# Patient Record
Sex: Female | Born: 1955 | Race: Black or African American | Hispanic: No | Marital: Married | Smoking: Never smoker
Health system: Southern US, Community
[De-identification: ages and names within clinical notes are randomized; demographics above are authoritative.]

## PROBLEM LIST (undated history)

## (undated) DIAGNOSIS — J302 Other seasonal allergic rhinitis: Secondary | ICD-10-CM

## (undated) DIAGNOSIS — M1990 Unspecified osteoarthritis, unspecified site: Secondary | ICD-10-CM

## (undated) HISTORY — PX: PARTIAL HYSTERECTOMY: SHX80

## (undated) HISTORY — PX: ABDOMINAL HYSTERECTOMY: SHX81

---

## 2008-05-10 ENCOUNTER — Ambulatory Visit: Payer: Self-pay | Admitting: Dermatology

## 2008-11-23 ENCOUNTER — Ambulatory Visit: Payer: Self-pay | Admitting: Family Medicine

## 2008-12-29 ENCOUNTER — Ambulatory Visit: Payer: Self-pay | Admitting: Gastroenterology

## 2010-03-26 ENCOUNTER — Other Ambulatory Visit: Payer: Self-pay | Admitting: Family Medicine

## 2010-04-04 ENCOUNTER — Ambulatory Visit: Payer: Self-pay | Admitting: Family Medicine

## 2010-10-25 ENCOUNTER — Other Ambulatory Visit: Payer: Self-pay | Admitting: Unknown Physician Specialty

## 2012-02-03 ENCOUNTER — Ambulatory Visit: Payer: Self-pay | Admitting: Family

## 2012-02-10 ENCOUNTER — Other Ambulatory Visit: Payer: Self-pay | Admitting: Family

## 2012-02-10 LAB — BASIC METABOLIC PANEL
Anion Gap: 9 (ref 7–16)
Co2: 29 mmol/L (ref 21–32)
Creatinine: 0.68 mg/dL (ref 0.60–1.30)
EGFR (African American): >60
EGFR (Non-African Amer.): >60
Glucose: 95 mg/dL (ref 65–99)
Potassium: 4.2 mmol/L (ref 3.5–5.1)
Sodium: 142 mmol/L (ref 136–145)

## 2012-02-10 LAB — CBC WITH DIFFERENTIAL/PLATELET
Basophil #: 0 10*3/uL (ref 0.0–0.1)
Basophil %: 0.9 %
Eosinophil #: 0.3 10*3/uL (ref 0.0–0.7)
Eosinophil %: 7.1 %
HCT: 42.6 % (ref 35.0–47.0)
HGB: 14.2 g/dL (ref 12.0–16.0)
Lymphocyte %: 37.7 %
MCH: 30.9 pg (ref 26.0–34.0)
Monocyte #: 0.6 10*3/uL (ref 0.0–0.7)
Monocyte %: 15.8 %
Neutrophil %: 38.5 %
Platelet: 210 10*3/uL (ref 150–440)
RBC: 4.58 10*6/uL (ref 3.80–5.20)
WBC: 3.8 10*3/uL (ref 3.6–11.0)

## 2012-02-10 LAB — LIPID PANEL
HDL Cholesterol: 51 mg/dL (ref 40–60)
Ldl Cholesterol, Calc: 111 mg/dL — ABNORMAL HIGH (ref 0–100)
Triglycerides: 131 mg/dL (ref 0–200)
VLDL Cholesterol, Calc: 26 mg/dL (ref 5–40)

## 2012-03-16 ENCOUNTER — Encounter: Payer: Self-pay | Admitting: Nurse Practitioner

## 2012-03-24 ENCOUNTER — Ambulatory Visit: Payer: Self-pay | Admitting: Internal Medicine

## 2012-03-25 ENCOUNTER — Encounter: Payer: Self-pay | Admitting: Nurse Practitioner

## 2012-04-25 ENCOUNTER — Encounter: Payer: Self-pay | Admitting: Nurse Practitioner

## 2013-05-12 ENCOUNTER — Ambulatory Visit: Payer: Self-pay | Admitting: Internal Medicine

## 2014-10-18 ENCOUNTER — Ambulatory Visit: Payer: Self-pay | Admitting: Internal Medicine

## 2016-02-23 ENCOUNTER — Other Ambulatory Visit: Payer: Self-pay | Admitting: Internal Medicine

## 2016-02-23 DIAGNOSIS — Z1231 Encounter for screening mammogram for malignant neoplasm of breast: Secondary | ICD-10-CM

## 2016-03-05 ENCOUNTER — Ambulatory Visit
Admission: RE | Admit: 2016-03-05 | Discharge: 2016-03-05 | Disposition: A | Discharge status: Home / Self Care | Payer: BC Managed Care – PPO | Source: Ambulatory Visit | Attending: Internal Medicine | Admitting: Internal Medicine

## 2016-03-05 DIAGNOSIS — Z1231 Encounter for screening mammogram for malignant neoplasm of breast: Secondary | ICD-10-CM | POA: Diagnosis not present

## 2017-04-29 ENCOUNTER — Ambulatory Visit
Admission: RE | Admit: 2017-04-29 | Discharge: 2017-04-29 | Disposition: A | Discharge status: Home / Self Care | Payer: BC Managed Care – PPO | Source: Ambulatory Visit | Attending: Internal Medicine | Admitting: Internal Medicine

## 2017-04-29 ENCOUNTER — Other Ambulatory Visit: Payer: Self-pay | Admitting: Cardiology

## 2017-04-29 ENCOUNTER — Ambulatory Visit
Admission: RE | Admit: 2017-04-29 | Discharge: 2017-04-29 | Disposition: A | Discharge status: Home / Self Care | Payer: BC Managed Care – PPO | Source: Ambulatory Visit | Attending: Cardiology | Admitting: Cardiology

## 2017-04-29 DIAGNOSIS — M4804 Spinal stenosis, thoracic region: Secondary | ICD-10-CM | POA: Insufficient documentation

## 2017-04-29 DIAGNOSIS — R52 Pain, unspecified: Secondary | ICD-10-CM | POA: Diagnosis not present

## 2017-04-29 DIAGNOSIS — M47897 Other spondylosis, lumbosacral region: Secondary | ICD-10-CM | POA: Diagnosis not present

## 2017-05-01 ENCOUNTER — Encounter: Payer: Self-pay | Admitting: Emergency Medicine

## 2017-05-01 ENCOUNTER — Emergency Department
Admission: EM | Admit: 2017-05-01 | Discharge: 2017-05-01 | Disposition: A | Discharge status: Home / Self Care | Payer: BC Managed Care – PPO | Attending: Emergency Medicine | Admitting: Emergency Medicine

## 2017-05-01 DIAGNOSIS — Z5321 Procedure and treatment not carried out due to patient leaving prior to being seen by health care provider: Secondary | ICD-10-CM | POA: Diagnosis not present

## 2017-05-01 DIAGNOSIS — M25552 Pain in left hip: Secondary | ICD-10-CM | POA: Diagnosis not present

## 2017-05-01 DIAGNOSIS — I10 Essential (primary) hypertension: Secondary | ICD-10-CM | POA: Diagnosis present

## 2017-05-01 HISTORY — DX: Unspecified osteoarthritis, unspecified site: M19.90

## 2017-05-01 NOTE — ED Triage Notes (Signed)
Pt comes into the ED via POV c/o hypertension and left hip pain.  Patient has been seeing PCP about hip pain and it has been diagnosed as arthritis.  Patient states she had her BP checked today and it was 190/109 earlier today and the nurse suggested she come to the ER.  Patient denies any h/o hypertension.  Denies any current chest pain, shortness of breath, or dizziness.  Patient able to ambulate well to triage and has even and unlabored respirations.

## 2017-05-01 NOTE — ED Notes (Signed)
Pt called in lobby, no response 

## 2017-05-01 NOTE — ED Notes (Signed)
Pt called in lobby for VSS, no response

## 2017-05-02 ENCOUNTER — Telehealth: Payer: Self-pay | Admitting: Emergency Medicine

## 2017-05-02 ENCOUNTER — Other Ambulatory Visit: Payer: Self-pay | Admitting: Internal Medicine

## 2017-05-02 DIAGNOSIS — R109 Unspecified abdominal pain: Secondary | ICD-10-CM

## 2017-05-02 NOTE — Telephone Encounter (Signed)
Called patient due to lwot to inquire about condition and follow up plans. Says her pcp called her and she is seeing him this am.

## 2017-05-08 ENCOUNTER — Ambulatory Visit
Admission: RE | Admit: 2017-05-08 | Discharge: 2017-05-08 | Disposition: A | Discharge status: Home / Self Care | Payer: BC Managed Care – PPO | Source: Ambulatory Visit | Attending: Internal Medicine | Admitting: Internal Medicine

## 2017-05-08 DIAGNOSIS — R109 Unspecified abdominal pain: Secondary | ICD-10-CM | POA: Insufficient documentation

## 2017-05-19 ENCOUNTER — Encounter: Payer: Self-pay | Admitting: Obstetrics and Gynecology

## 2017-05-19 ENCOUNTER — Ambulatory Visit (INDEPENDENT_AMBULATORY_CARE_PROVIDER_SITE_OTHER): Payer: BC Managed Care – PPO | Admitting: Obstetrics and Gynecology

## 2017-05-19 VITALS — BP 112/72 | HR 78 | Ht 62.0 in | Wt 179.0 lb

## 2017-05-19 DIAGNOSIS — R1032 Left lower quadrant pain: Secondary | ICD-10-CM

## 2017-05-19 DIAGNOSIS — N761 Subacute and chronic vaginitis: Secondary | ICD-10-CM | POA: Diagnosis not present

## 2017-05-19 LAB — POCT WET PREP WITH KOH
CLUE CELLS WET PREP PER HPF POC: NEGATIVE
KOH Prep POC: NEGATIVE
Trichomonas, UA: NEGATIVE
Yeast Wet Prep HPF POC: NEGATIVE

## 2017-05-19 MED ORDER — CLOTRIMAZOLE-BETAMETHASONE 1-0.05 % EX CREA: 1.0000 "application " | TOPICAL_CREAM | Freq: Two times a day (BID) | CUTANEOUS | 0 refills | Status: AC

## 2017-05-19 NOTE — Progress Notes (Signed)
Chief Complaint  Patient presents with  . Pelvic Pain    HPI:      Ms. Marilyn Morgan is a 61 y.o. [phone] who LMP was No LMP recorded. Patient has had a hysterectomy., presents today for NP eval of pelvic pain and vaginal irritation. Pt complains of LT hip pain that radiated to LLQ for about 2 wks earlier this month. Pain was sharp and constant initially, worse with walking, standing, sitting, lying down. Pain started to decrease towards the end of 2 wks and only occurred with lying down. Pt also had pain radiating down back of LT leg. No GI or urin sx with pain. She has had similar sx off and on over the past couple of yrs. 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 She had xrays with PCP that showed arthritis in LT hip. She was given meloxicam and gabapentin. Sx have resolved and no sx today. She had a neg abd CT scan with PCP, too. She is s/p lap hyst for leio. Pt states she only has 1 ovary now. No VB, spotting.   She also complains of 6 months of vaginal itching/irritation without increased d/c, mild odor. She has not used meds to treat. She uses caress body wash and dryer sheets.      There are no active problems to display for this patient.  Past Surgical History:  Procedure Laterality Date  . CESAREAN SECTION     x2  . PARTIAL HYSTERECTOMY       Family History  Problem Relation Age of Onset  . COPD Mother   . Diabetes Mother   . Aneurysm Father   . COPD Father   . Stroke Father     Social History   Social History  . Marital status: Married    Spouse name: N/A  . Number of children: N/A  . Years of education: N/A   Occupational History  . Not on file.   Social History Main Topics  . Smoking status: Never Smoker  . Smokeless tobacco: Never Used  . Alcohol use No  . Drug use: No  . Sexual activity: Yes    Birth control/ protection: Surgical   Other Topics Concern  . Not on file   Social History Narrative  . No narrative on file      Current Outpatient Prescriptions:  .  cetirizine (ZYRTEC) 10 MG tablet, Take 10 mg by mouth daily., Disp: , Rfl: 9 .  clotrimazole-betamethasone (LOTRISONE) cream, Apply 1 application topically 2 (two) times daily. Apply externally BID for 2 wks, Disp: 15 g, Rfl: 0  Review of Systems  Constitutional: Negative for fever.  Gastrointestinal: Negative for blood in stool, constipation, diarrhea, nausea and vomiting.  Genitourinary: Positive for pelvic pain. Negative for dyspareunia, dysuria, flank pain, frequency, hematuria, urgency, vaginal bleeding, vaginal discharge and vaginal pain.  Musculoskeletal: Positive for arthralgias and back pain.  Skin: Negative for rash.     OBJECTIVE:   Vitals:  BP 112/72   Pulse 78   Ht 5\' 2"  (1.575 m)   Wt 179 lb (81.2 kg)   BMI 32.74 kg/m   Physical Exam  Constitutional: She is oriented to person, place, and time and well-developed, well-nourished, and in no distress. Vital signs are normal.  Abdominal: Normal appearance. She exhibits no distension and no mass. There is no tenderness.  Genitourinary: Right adnexa normal and left adnexa normal. Right adnexum displays no mass and no tenderness. Left adnexum  displays no mass and no tenderness. Vulva exhibits no erythema, no exudate, no lesion, no rash and no tenderness. Vagina exhibits no lesion. Thin  odorless  white and vaginal discharge found.  Genitourinary Comments: VULVA WITH PALE, HYPERTROPHIED ARE LT LABIA MAJORA AND PERINEAL AREA  UTERUS/CX SURG ABSENT  Neurological: She is oriented to person, place, and time.  Vitals reviewed.   Results: Results for orders placed or performed in visit on 05/19/17 (from the past 24 hour(s))  POCT Wet Prep with KOH     Status: Normal   Collection Time: 05/19/17  5:08 PM  Result Value Ref Range   Trichomonas, UA Negative    Clue Cells Wet Prep HPF POC neg    Epithelial Wet Prep HPF POC  Few, Moderate, Many, Too numerous to count   Yeast Wet Prep HPF  POC neg    Bacteria Wet Prep HPF POC  Few   RBC Wet Prep HPF POC     WBC Wet Prep HPF POC     KOH Prep POC Negative Negative     Assessment/Plan:  LLQ pain - Resolved. Prob most likely related to LT hip arthritis. If sx recur, will check u/s.  Chronic vaginitis - Neg wet prep/pos exam. Question fungal vs chem. Rx clotrimazole/betamethasone crm BID for 2 wks. F/u if sx persist for further eval.  - Plan: POCT Wet Prep with KOH  Line dry underwear/dove sens skin soap.      No Follow-up on file.  Cayli Escajeda B. Dajana Gehrig, PA-C 05/19/2017 5:10 PM

## 2019-05-11 ENCOUNTER — Other Ambulatory Visit: Payer: Self-pay | Admitting: Internal Medicine

## 2019-05-11 DIAGNOSIS — Z1231 Encounter for screening mammogram for malignant neoplasm of breast: Secondary | ICD-10-CM

## 2019-06-17 ENCOUNTER — Ambulatory Visit
Admission: RE | Admit: 2019-06-17 | Discharge: 2019-06-17 | Disposition: A | Discharge status: Home / Self Care | Payer: BC Managed Care – PPO | Source: Ambulatory Visit | Attending: Internal Medicine | Admitting: Internal Medicine

## 2019-06-17 DIAGNOSIS — Z1231 Encounter for screening mammogram for malignant neoplasm of breast: Secondary | ICD-10-CM | POA: Diagnosis not present

## 2019-07-06 ENCOUNTER — Other Ambulatory Visit: Payer: Self-pay | Admitting: Internal Medicine

## 2019-07-06 DIAGNOSIS — N6320 Unspecified lump in the left breast, unspecified quadrant: Secondary | ICD-10-CM

## 2019-07-06 DIAGNOSIS — R928 Other abnormal and inconclusive findings on diagnostic imaging of breast: Secondary | ICD-10-CM

## 2019-07-14 ENCOUNTER — Ambulatory Visit
Admission: RE | Admit: 2019-07-14 | Discharge: 2019-07-14 | Disposition: A | Discharge status: Home / Self Care | Payer: BC Managed Care – PPO | Source: Ambulatory Visit | Attending: Internal Medicine | Admitting: Internal Medicine

## 2019-07-14 DIAGNOSIS — R928 Other abnormal and inconclusive findings on diagnostic imaging of breast: Secondary | ICD-10-CM | POA: Diagnosis present

## 2019-07-14 DIAGNOSIS — N6320 Unspecified lump in the left breast, unspecified quadrant: Secondary | ICD-10-CM | POA: Insufficient documentation

## 2020-10-05 ENCOUNTER — Other Ambulatory Visit: Payer: Self-pay | Admitting: Family Medicine

## 2020-10-05 DIAGNOSIS — Z1231 Encounter for screening mammogram for malignant neoplasm of breast: Secondary | ICD-10-CM

## 2020-11-27 ENCOUNTER — Ambulatory Visit
Admission: RE | Admit: 2020-11-27 | Discharge: 2020-11-27 | Disposition: A | Discharge status: Home / Self Care | Payer: BC Managed Care – PPO | Source: Ambulatory Visit | Attending: Family Medicine | Admitting: Family Medicine

## 2020-11-27 ENCOUNTER — Other Ambulatory Visit: Payer: Self-pay

## 2020-11-27 DIAGNOSIS — Z1231 Encounter for screening mammogram for malignant neoplasm of breast: Secondary | ICD-10-CM | POA: Diagnosis present

## 2021-03-14 ENCOUNTER — Other Ambulatory Visit: Admission: RE | Admit: 2021-03-14 | Payer: BC Managed Care – PPO | Source: Ambulatory Visit

## 2021-03-15 ENCOUNTER — Encounter: Payer: Self-pay | Admitting: *Deleted

## 2021-03-16 ENCOUNTER — Other Ambulatory Visit: Payer: Self-pay

## 2021-03-16 ENCOUNTER — Ambulatory Visit
Admission: RE | Admit: 2021-03-16 | Discharge: 2021-03-16 | Disposition: A | Discharge status: Home / Self Care | Payer: BC Managed Care – PPO | Attending: Gastroenterology | Admitting: Gastroenterology

## 2021-03-16 ENCOUNTER — Encounter: Payer: Self-pay | Admitting: *Deleted

## 2021-03-16 ENCOUNTER — Ambulatory Visit: Payer: BC Managed Care – PPO | Admitting: Certified Registered"

## 2021-03-16 ENCOUNTER — Encounter
Admission: RE | Disposition: A | Discharge status: Home / Self Care | Payer: Self-pay | Source: Home / Self Care | Attending: Gastroenterology

## 2021-03-16 DIAGNOSIS — K640 First degree hemorrhoids: Secondary | ICD-10-CM | POA: Diagnosis not present

## 2021-03-16 DIAGNOSIS — K5730 Diverticulosis of large intestine without perforation or abscess without bleeding: Secondary | ICD-10-CM | POA: Diagnosis not present

## 2021-03-16 DIAGNOSIS — Z79899 Other long term (current) drug therapy: Secondary | ICD-10-CM | POA: Insufficient documentation

## 2021-03-16 DIAGNOSIS — Z1211 Encounter for screening for malignant neoplasm of colon: Secondary | ICD-10-CM | POA: Insufficient documentation

## 2021-03-16 DIAGNOSIS — Z791 Long term (current) use of non-steroidal anti-inflammatories (NSAID): Secondary | ICD-10-CM | POA: Insufficient documentation

## 2021-03-16 DIAGNOSIS — K6389 Other specified diseases of intestine: Secondary | ICD-10-CM | POA: Diagnosis not present

## 2021-03-16 HISTORY — DX: Other seasonal allergic rhinitis: J30.2

## 2021-03-16 HISTORY — PX: COLONOSCOPY WITH PROPOFOL: SHX5780

## 2021-03-16 SURGERY — COLONOSCOPY WITH PROPOFOL
Anesthesia: General

## 2021-03-16 MED ORDER — SODIUM CHLORIDE 0.9 % IV SOLN: INTRAVENOUS | Status: DC

## 2021-03-16 MED ORDER — PROPOFOL 500 MG/50ML IV EMUL
INTRAVENOUS | Status: DC | PRN
  Administered 2021-03-16: 165 ug/kg/min via INTRAVENOUS

## 2021-03-16 MED ORDER — PROPOFOL 10 MG/ML IV BOLUS
INTRAVENOUS | Status: DC | PRN
  Administered 2021-03-16: 70 mg via INTRAVENOUS
  Administered 2021-03-16 (×3): 20 mg via INTRAVENOUS

## 2021-03-16 MED ORDER — LIDOCAINE HCL (CARDIAC) PF 100 MG/5ML IV SOSY
PREFILLED_SYRINGE | INTRAVENOUS | Status: DC | PRN
  Administered 2021-03-16: 100 mg via INTRAVENOUS

## 2021-03-16 NOTE — Anesthesia Preprocedure Evaluation (Addendum)
Anesthesia Evaluation  Patient identified by MRN, date of birth, ID band Patient awake    Reviewed: Allergy & Precautions, NPO status , Patient's Chart, lab work & pertinent test results  History of Anesthesia Complications Negative for: history of anesthetic complications  Airway Mallampati: II       Dental   Pulmonary neg sleep apnea, neg COPD, Not current smoker,           Cardiovascular (-) hypertension(-) Past MI and (-) CHF (-) dysrhythmias (-) Valvular Problems/Murmurs     Neuro/Psych neg Seizures    GI/Hepatic Neg liver ROS, neg GERD  ,  Endo/Other  neg diabetes  Renal/GU negative Renal ROS     Musculoskeletal   Abdominal   Peds  Hematology   Anesthesia Other Findings   Reproductive/Obstetrics                             Anesthesia Physical Anesthesia Plan  ASA: II  Anesthesia Plan: General   Post-op Pain Management:    Induction: Intravenous  PONV Risk Score and Plan: 3 and Propofol infusion and TIVA  Airway Management Planned: Nasal Cannula  Additional Equipment:   Intra-op Plan:   Post-operative Plan:   Informed Consent: I have reviewed the patients History and Physical, chart, labs and discussed the procedure including the risks, benefits and alternatives for the proposed anesthesia with the patient or authorized representative who has indicated his/her understanding and acceptance.       Plan Discussed with:   Anesthesia Plan Comments:        Anesthesia Quick Evaluation

## 2021-03-16 NOTE — Op Note (Signed)
[facility] Gastroenterology Patient Name: Marilyn Morgan Procedure Date: 03/16/2021 1:32 PM MRN: [id number] Account #: [id number] Date of Birth: [birth year] Admit Type: Outpatient Age: 65 Room: [facility] ENDO ROOM 3 Gender: Female Note Status: Finalized Procedure:             Colonoscopy Indications:           Screening for colorectal malignant neoplasm Providers:             Andrey Farmer MD, MD Medicines:             Monitored Anesthesia Care Complications:         No immediate complications. Estimated blood loss:                         Minimal. Procedure:             Pre-Anesthesia Assessment:                        - Prior to the procedure, a History and Physical was                         performed, and patient medications and allergies were                         reviewed. The patient is competent. The risks and                         benefits of the procedure and the sedation options and                         risks were discussed with the patient. All questions                         were answered and informed consent was obtained.                         Patient identification and proposed procedure were                         verified by the physician, the nurse, the anesthetist                         and the technician in the endoscopy suite. Mental                         Status Examination: alert and oriented. Airway                         Examination: normal oropharyngeal airway and neck                         mobility. Respiratory Examination: clear to                         auscultation. CV Examination: normal. Prophylactic                         Antibiotics: The patient does not require prophylactic  antibiotics. Prior Anticoagulants: The patient has                         taken no previous anticoagulant or antiplatelet                         agents. ASA Grade Assessment: II - A patient with mild                          systemic disease. After reviewing the risks and                         benefits, the patient was deemed in satisfactory                         condition to undergo the procedure. The anesthesia                         plan was to use monitored anesthesia care (MAC).                         Immediately prior to administration of medications,                         the patient was re-assessed for adequacy to receive                         sedatives. The heart rate, respiratory rate, oxygen                         saturations, blood pressure, adequacy of pulmonary                         ventilation, and response to care were monitored                         throughout the procedure. The physical status of the                         patient was re-assessed after the procedure.                        After obtaining informed consent, the colonoscope was                         passed under direct vision. Throughout the procedure,                         the patient's blood pressure, pulse, and oxygen                         saturations were monitored continuously. The                         Colonoscope was introduced through the anus and                         advanced to the the cecum, identified by appendiceal  orifice and ileocecal valve. The colonoscopy was                         performed without difficulty. The patient tolerated                         the procedure well. The quality of the bowel                         preparation was good. Findings:      The perianal and digital rectal examinations were normal.      A localized area of moderately granular mucosa was found at the       ileocecal valve. Initially appeared as adenomatous polyp but more likely       the transition from ileal mucosa to colonic mucosa. Biopsies were taken       with a cold forceps for histology. Estimated blood loss was minimal.      A single small-mouthed diverticulum was  found in the sigmoid colon.      Internal hemorrhoids were found during retroflexion. The hemorrhoids       were Grade I (internal hemorrhoids that do not prolapse).      The exam was otherwise without abnormality on direct and retroflexion       views. Impression:            - Granular mucosa at the ileocecal valve. Biopsied.                        - Diverticulosis in the sigmoid colon.                        - Internal hemorrhoids.                        - The examination was otherwise normal on direct and                         retroflexion views. Recommendation:        - Discharge patient to home.                        - Resume previous diet.                        - Continue present medications.                        - Await pathology results.                        - Repeat colonoscopy for surveillance based on                         pathology results.                        - Return to referring physician as previously                         scheduled. Procedure Code(s):     --- Professional ---  45380, Colonoscopy, flexible; with biopsy, single or                         multiple Diagnosis Code(s):     --- Professional ---                        Z12.11, Encounter for screening for malignant neoplasm                         of colon                        K63.89, Other specified diseases of intestine                        K64.0, First degree hemorrhoids                        K57.30, Diverticulosis of large intestine without                         perforation or abscess without bleeding CPT copyright 2019 [employer]. All rights reserved. The codes documented in this report are preliminary and upon coder review may  be revised to meet current compliance requirements. Andrey Farmer MD, MD 03/16/2021 2:10:15 PM Number of Addenda: 0 Note Initiated On: 03/16/2021 1:32 PM Total Procedure Duration: 0 hours 17 minutes 6 seconds   Estimated Blood Loss:  Estimated blood loss was minimal.      [facility]

## 2021-03-16 NOTE — Anesthesia Procedure Notes (Signed)
Procedure Name: General with mask airway Performed by: Fletcher-Harrison, Phelan Schadt, CRNA Pre-anesthesia Checklist: Patient identified, Emergency Drugs available, Suction available and Patient being monitored Patient Re-evaluated:Patient Re-evaluated prior to induction Oxygen Delivery Method: Simple face mask Induction Type: IV induction Placement Confirmation: positive ETCO2 and CO2 detector Dental Injury: Teeth and Oropharynx as per pre-operative assessment        

## 2021-03-16 NOTE — H&P (Signed)
Outpatient short stay form Pre-procedure 03/16/2021 1:37 PM Merlyn Lot MD, MPH  Primary Physician: PA Shelton Silvas  Reason for visit:  Screening  History of present illness:   65 y/o lady with no significant medical history here for screening colonoscopy. Had colonoscopy over 10 years ago but unknown results. History of hysterectomy and c-sections. No blood thinners. No family history of GI malignancies.    Current Facility-Administered Medications:  .  0.9 %  sodium chloride infusion, , Intravenous, Continuous, Havier Deeb, Rossie Muskrat, MD, Last Rate: 20 mL/hr at 03/16/21 1316, New Bag at 03/16/21 1316  Medications Prior to Admission  Medication Sig Dispense Refill Last Dose  . cetirizine (ZYRTEC) 10 MG tablet Take 10 mg by mouth daily.  9 Past Month at Unknown time  . fluticasone (FLONASE) 50 MCG/ACT nasal spray Place 2 sprays into both nostrils daily.     . clotrimazole-betamethasone (LOTRISONE) cream Apply 1 application topically 2 (two) times daily. Apply externally BID for 2 wks (Patient not taking: Reported on 03/16/2021) 15 g 0 Not Taking at Unknown time  . meloxicam (MOBIC) 7.5 MG tablet Take 7.5 mg by mouth daily. (Patient not taking: Reported on 03/16/2021)   Not Taking at Unknown time     No Known Allergies   Past Medical History:  Diagnosis Date  . Arthritis   . Seasonal allergies     Review of systems:  Otherwise negative.    Physical Exam  Gen: Alert, oriented. Appears stated age.  HEENT: PERRLA. Lungs: No respiratory distress CV: RRR Abd: soft, benign, no masses Ext: No edema    Planned procedures: Proceed with colonoscopy. The patient understands the nature of the planned procedure, indications, risks, alternatives and potential complications including but not limited to bleeding, infection, perforation, damage to internal organs and possible oversedation/side effects from anesthesia. The patient agrees and gives consent to proceed.  Please refer to  procedure notes for findings, recommendations and patient disposition/instructions.     Merlyn Lot MD, MPH Gastroenterology 03/16/2021  1:37 PM

## 2021-03-16 NOTE — Interval H&P Note (Signed)
History and Physical Interval Note:  03/16/2021 1:39 PM  Marilyn Morgan  has presented today for surgery, with the diagnosis of CCA Screen.  The various methods of treatment have been discussed with the patient and family. After consideration of risks, benefits and other options for treatment, the patient has consented to  Procedure(s): COLONOSCOPY WITH PROPOFOL (N/A) as a surgical intervention.  The patient's history has been reviewed, patient examined, no change in status, stable for surgery.  I have reviewed the patient's chart and labs.  Questions were answered to the patient's satisfaction.     Regis Bill  Ok to proceed with colonoscopy

## 2021-03-16 NOTE — Transfer of Care (Signed)
Immediate Anesthesia Transfer of Care Note  Patient: Marilyn Morgan  Procedure(s) Performed: COLONOSCOPY WITH PROPOFOL (N/A )  Patient Location: Endoscopy Unit  Anesthesia Type:General  Level of Consciousness: awake, drowsy and patient cooperative  Airway & Oxygen Therapy: Patient Spontanous Breathing and Patient connected to face mask oxygen  Post-op Assessment: Report given to RN and Post -op Vital signs reviewed and stable  Post vital signs: Reviewed and stable  Last Vitals:  Vitals Value Taken Time  BP 139/97 03/16/21 1410  Temp    Pulse 86 03/16/21 1410  Resp 19 03/16/21 1410  SpO2 98 % 03/16/21 1410  Vitals shown include unvalidated device data.  Last Pain:  Vitals:   03/16/21 1259  TempSrc: Temporal  PainSc: 0-No pain         Complications: No complications documented.

## 2021-03-19 ENCOUNTER — Encounter: Payer: Self-pay | Admitting: Gastroenterology

## 2021-03-20 LAB — SURGICAL PATHOLOGY

## 2021-03-22 NOTE — Anesthesia Postprocedure Evaluation (Addendum)
Anesthesia Post Note  Patient: Marilyn Morgan  Procedure(s) Performed: COLONOSCOPY WITH PROPOFOL (N/A )  Patient location during evaluation: Endoscopy Anesthesia Type: General Level of consciousness: awake and alert Pain management: pain level controlled Vital Signs Assessment: post-procedure vital signs reviewed and stable Respiratory status: spontaneous breathing, nonlabored ventilation, respiratory function stable and patient connected to nasal cannula oxygen Cardiovascular status: blood pressure returned to baseline and stable Postop Assessment: no apparent nausea or vomiting Anesthetic complications: no   No complications documented.   Last Vitals:  Vitals:   03/16/21 1259 03/16/21 1410  BP: (!) 164/98 (!) 139/97  Pulse: 76   Resp: 18   Temp: (!) 35.9 C (!) 36 C  SpO2: 98%     Last Pain:  Vitals:   03/17/21 0936  TempSrc:   PainSc: 0-No pain                 Lenard Simmer

## 2022-09-03 ENCOUNTER — Other Ambulatory Visit: Payer: Self-pay | Admitting: Family Medicine

## 2022-09-03 DIAGNOSIS — Z1231 Encounter for screening mammogram for malignant neoplasm of breast: Secondary | ICD-10-CM

## 2022-10-01 ENCOUNTER — Ambulatory Visit
Admission: RE | Admit: 2022-10-01 | Discharge: 2022-10-01 | Disposition: A | Discharge status: Home / Self Care | Payer: Medicare PPO | Source: Ambulatory Visit | Attending: Family Medicine | Admitting: Family Medicine

## 2022-10-01 DIAGNOSIS — Z1231 Encounter for screening mammogram for malignant neoplasm of breast: Secondary | ICD-10-CM | POA: Insufficient documentation

## 2022-12-08 IMAGING — MG DIGITAL SCREENING BILAT W/ TOMO W/ CAD
8 series · 8 of 24 positions shown · non-contrast
Comparison: Previous exam(s).

ACR Breast Density Category a: The breast tissue is almost entirely
fatty.

CLINICAL DATA: Screening.

EXAM:
DIGITAL SCREENING BILATERAL MAMMOGRAM WITH TOMO AND CAD

[L CC synth-2D]
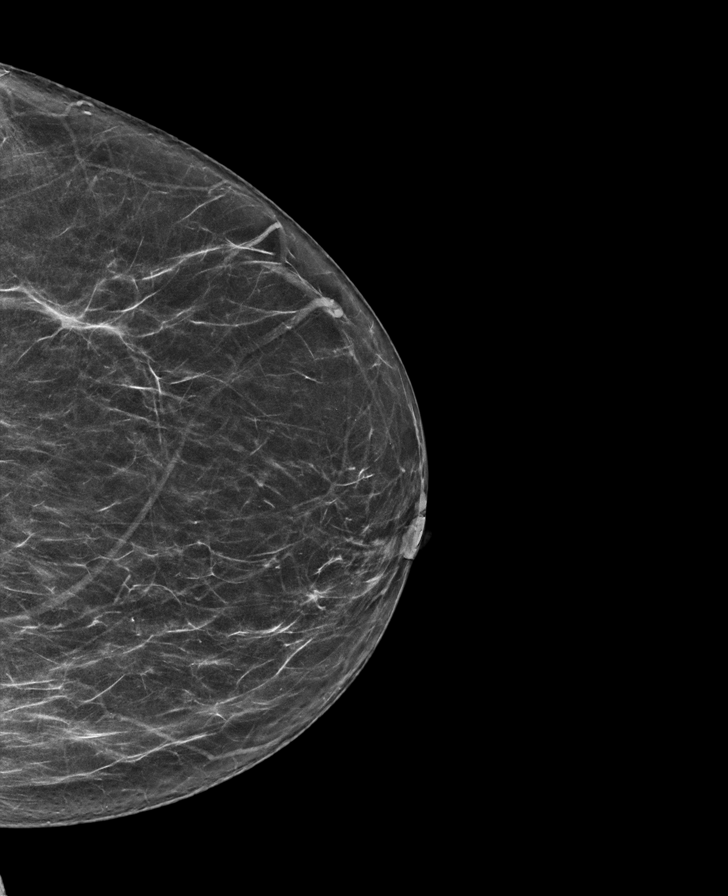

[R CC synth-2D]
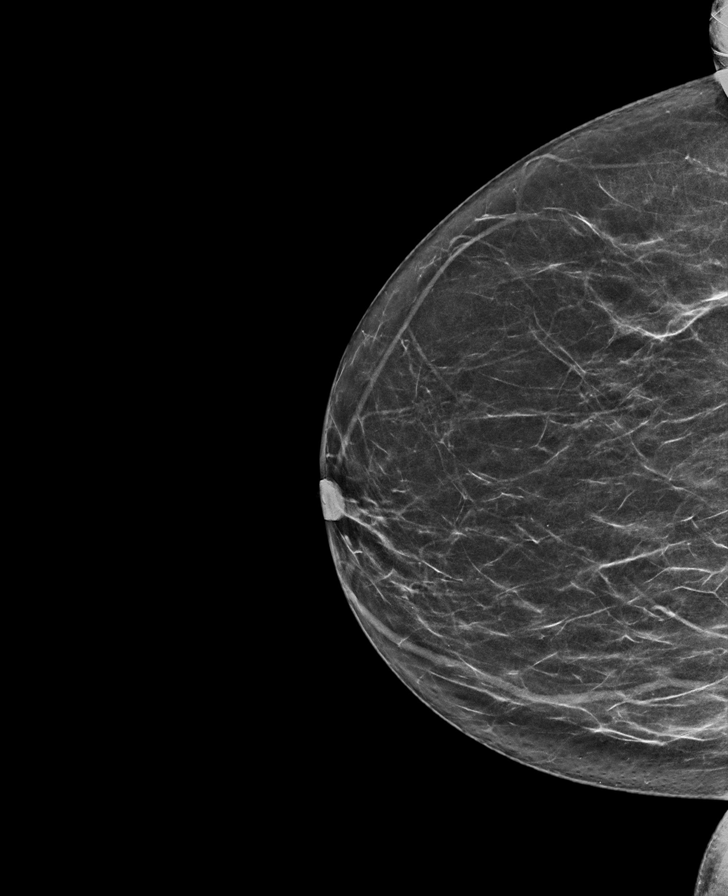

[R MLO synth-2D]
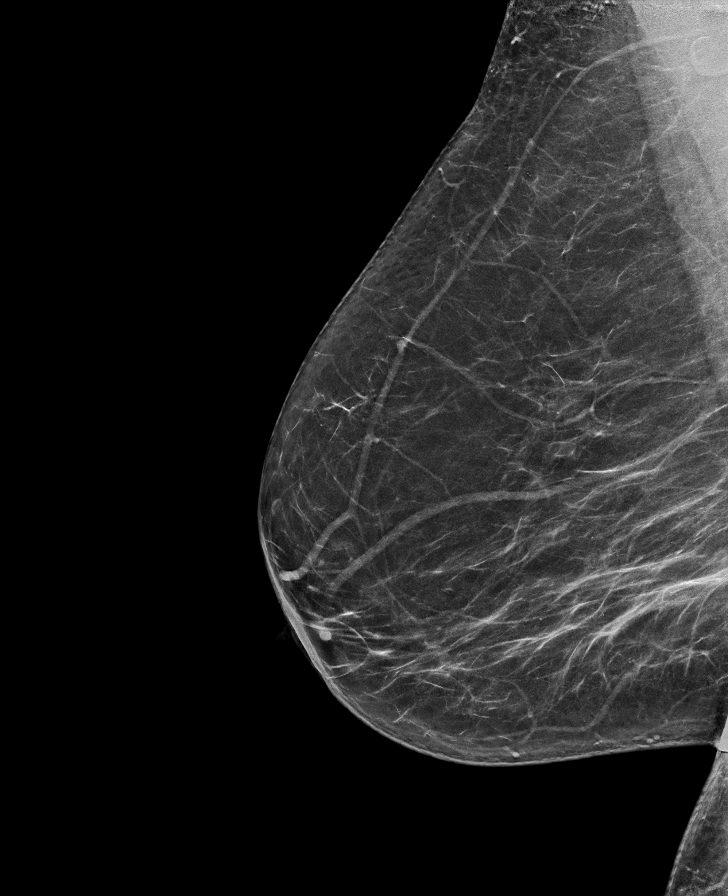

[L MLO synth-2D]
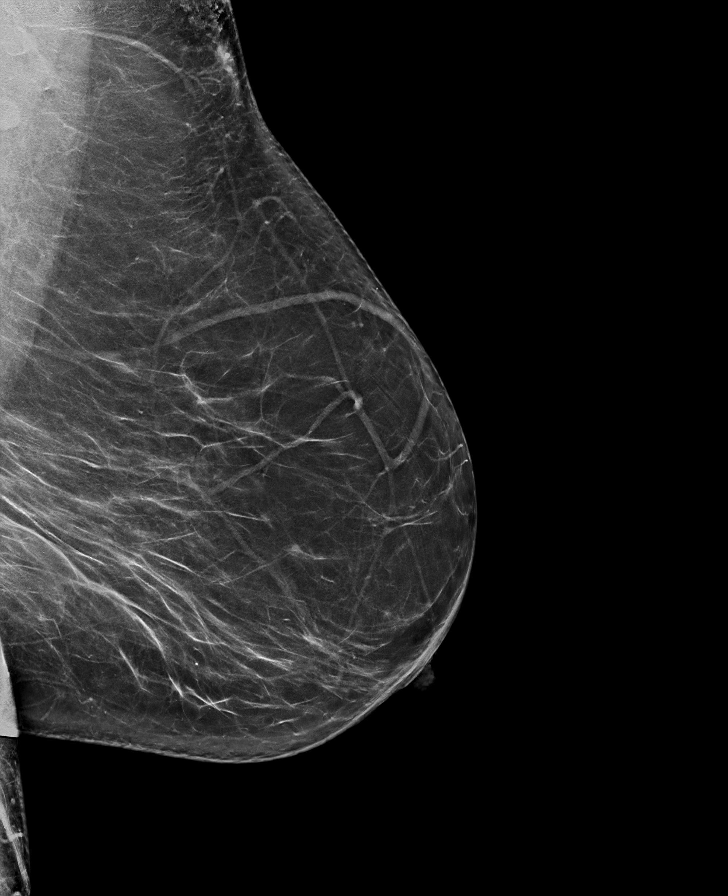

[L CC tomo · tomo slice 31/61.0]
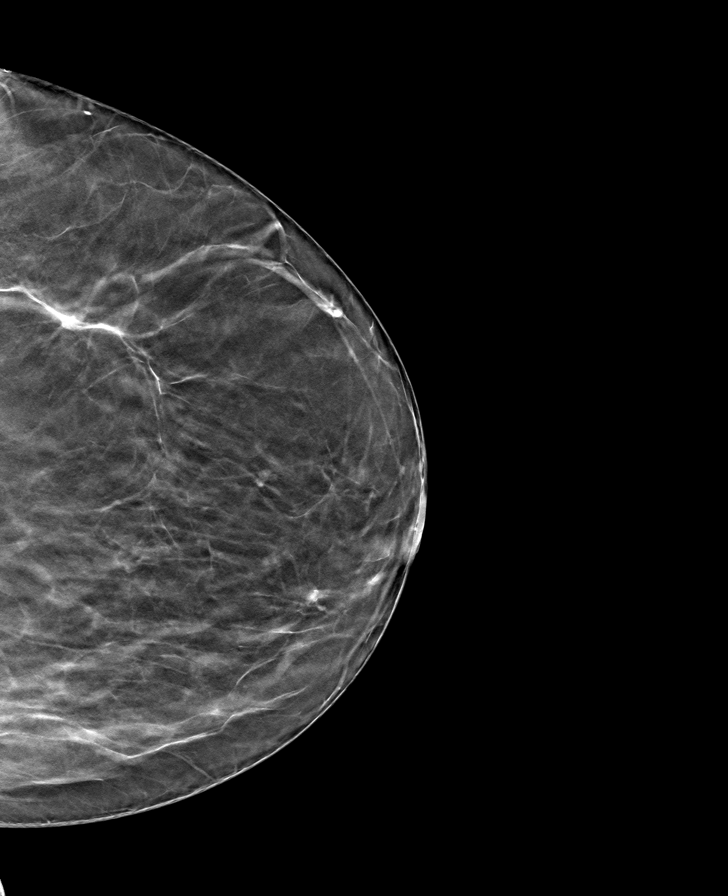

[R MLO tomo · tomo slice 37/72.0]
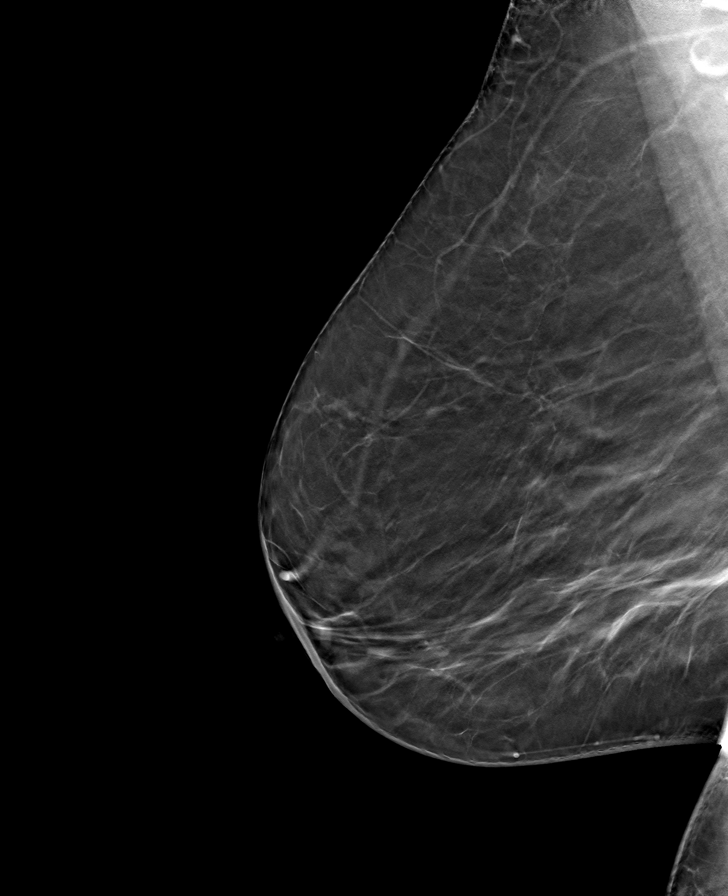

[R CC tomo · tomo slice 31/62.0]
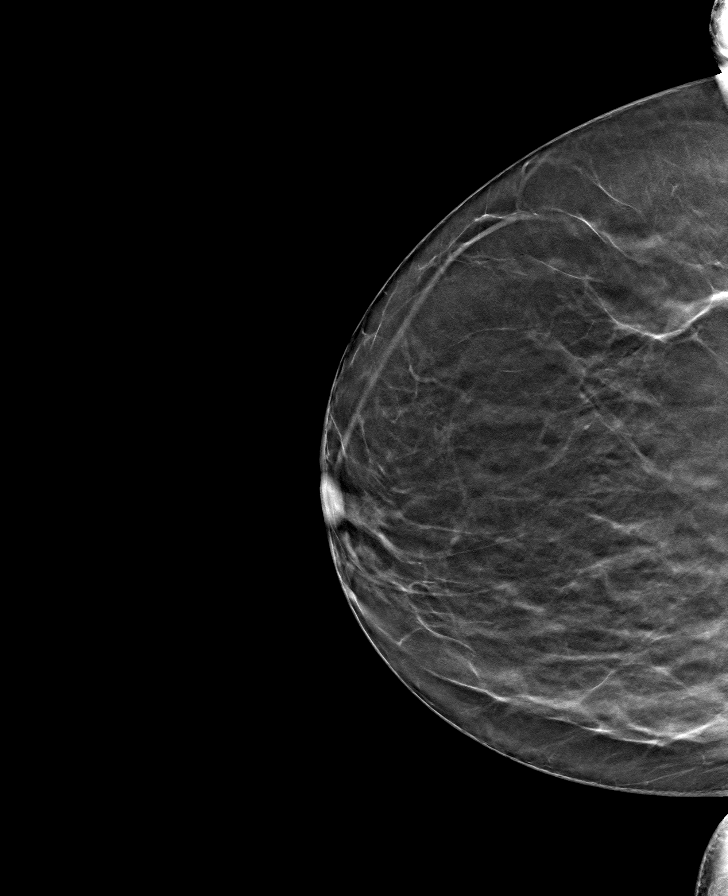

[L MLO tomo · tomo slice 39/77.0]
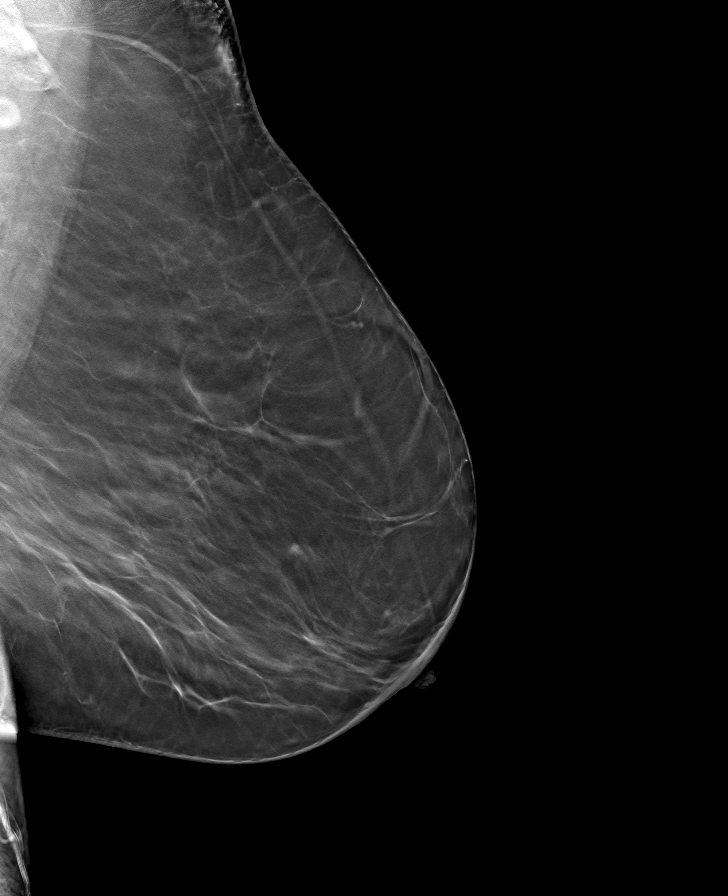

[8 of 24 positions shown; findings below may reference images not displayed]

FINDINGS: There are no findings suspicious for malignancy. Images were
processed with CAD.
IMPRESSION: No mammographic evidence of malignancy. A result letter of this
screening mammogram will be mailed directly to the patient.

RECOMMENDATION:
Screening mammogram in one year. (Code:[id number])

BI-RADS CATEGORY  1: Negative.

## 2023-09-08 ENCOUNTER — Other Ambulatory Visit: Payer: Self-pay | Admitting: Family Medicine

## 2023-09-08 DIAGNOSIS — Z1231 Encounter for screening mammogram for malignant neoplasm of breast: Secondary | ICD-10-CM

## 2023-10-03 ENCOUNTER — Ambulatory Visit
Admission: RE | Admit: 2023-10-03 | Discharge: 2023-10-03 | Disposition: A | Discharge status: Home / Self Care | Payer: Medicare PPO | Source: Ambulatory Visit | Attending: Family Medicine | Admitting: Family Medicine

## 2023-10-03 DIAGNOSIS — Z1231 Encounter for screening mammogram for malignant neoplasm of breast: Secondary | ICD-10-CM | POA: Diagnosis present

## 2024-10-20 ENCOUNTER — Other Ambulatory Visit: Payer: Self-pay | Admitting: Nurse Practitioner

## 2024-10-20 DIAGNOSIS — Z1231 Encounter for screening mammogram for malignant neoplasm of breast: Secondary | ICD-10-CM

## 2024-12-07 ENCOUNTER — Ambulatory Visit
Admission: RE | Admit: 2024-12-07 | Discharge: 2024-12-07 | Disposition: A | Discharge status: Home / Self Care | Source: Ambulatory Visit | Attending: Nurse Practitioner | Admitting: Nurse Practitioner

## 2024-12-07 DIAGNOSIS — Z1231 Encounter for screening mammogram for malignant neoplasm of breast: Secondary | ICD-10-CM | POA: Diagnosis present
